# Patient Record
Sex: Male | Born: 1960 | Hispanic: No | Marital: Married | Smoking: Former smoker
Health system: Southern US, Community
[De-identification: ages and names within clinical notes are randomized; demographics above are authoritative.]

## PROBLEM LIST (undated history)

## (undated) DIAGNOSIS — I10 Essential (primary) hypertension: Secondary | ICD-10-CM

## (undated) DIAGNOSIS — I639 Cerebral infarction, unspecified: Secondary | ICD-10-CM

## (undated) DIAGNOSIS — C73 Malignant neoplasm of thyroid gland: Secondary | ICD-10-CM

## (undated) HISTORY — PX: THYROIDECTOMY: SHX17

---

## 2008-05-28 ENCOUNTER — Encounter: Admission: RE | Admit: 2008-05-28 | Discharge: 2008-05-28 | Payer: Self-pay | Admitting: Specialist

## 2016-12-06 ENCOUNTER — Emergency Department (HOSPITAL_BASED_OUTPATIENT_CLINIC_OR_DEPARTMENT_OTHER)
Admission: EM | Admit: 2016-12-06 | Discharge: 2016-12-07 | Disposition: A | Discharge status: Home / Self Care | Payer: Self-pay | Attending: Emergency Medicine | Admitting: Emergency Medicine

## 2016-12-06 ENCOUNTER — Encounter (HOSPITAL_BASED_OUTPATIENT_CLINIC_OR_DEPARTMENT_OTHER): Payer: Self-pay

## 2016-12-06 ENCOUNTER — Emergency Department (HOSPITAL_BASED_OUTPATIENT_CLINIC_OR_DEPARTMENT_OTHER): Payer: Self-pay

## 2016-12-06 DIAGNOSIS — R05 Cough: Secondary | ICD-10-CM | POA: Insufficient documentation

## 2016-12-06 DIAGNOSIS — R55 Syncope and collapse: Secondary | ICD-10-CM | POA: Insufficient documentation

## 2016-12-06 DIAGNOSIS — R054 Cough syncope: Secondary | ICD-10-CM

## 2016-12-06 DIAGNOSIS — Z87891 Personal history of nicotine dependence: Secondary | ICD-10-CM | POA: Insufficient documentation

## 2016-12-06 DIAGNOSIS — R200 Anesthesia of skin: Secondary | ICD-10-CM | POA: Insufficient documentation

## 2016-12-06 DIAGNOSIS — Z85850 Personal history of malignant neoplasm of thyroid: Secondary | ICD-10-CM | POA: Insufficient documentation

## 2016-12-06 DIAGNOSIS — Z8673 Personal history of transient ischemic attack (TIA), and cerebral infarction without residual deficits: Secondary | ICD-10-CM | POA: Insufficient documentation

## 2016-12-06 DIAGNOSIS — I10 Essential (primary) hypertension: Secondary | ICD-10-CM | POA: Insufficient documentation

## 2016-12-06 DIAGNOSIS — R202 Paresthesia of skin: Secondary | ICD-10-CM | POA: Insufficient documentation

## 2016-12-06 HISTORY — DX: Essential (primary) hypertension: I10

## 2016-12-06 HISTORY — DX: Malignant neoplasm of thyroid gland: C73

## 2016-12-06 HISTORY — DX: Cerebral infarction, unspecified: I63.9

## 2016-12-06 LAB — CBC WITH DIFFERENTIAL/PLATELET
BASOS PCT: 1 %
Basophils Absolute: 0.1 10*3/uL (ref 0.0–0.1)
EOS ABS: 0.4 10*3/uL (ref 0.0–0.7)
EOS PCT: 5 %
HEMATOCRIT: 40.8 % (ref 39.0–52.0)
HEMOGLOBIN: 14 g/dL (ref 13.0–17.0)
LYMPHS PCT: 23 %
Lymphs Abs: 1.8 10*3/uL (ref 0.7–4.0)
MCH: 27.6 pg (ref 26.0–34.0)
MCHC: 34.3 g/dL (ref 30.0–36.0)
MCV: 80.5 fL (ref 78.0–100.0)
MONOS PCT: 8 %
Monocytes Absolute: 0.6 10*3/uL (ref 0.1–1.0)
NEUTROS PCT: 63 %
Neutro Abs: 4.9 10*3/uL (ref 1.7–7.7)
Platelets: 258 10*3/uL (ref 150–400)
RBC: 5.07 MIL/uL (ref 4.22–5.81)
RDW: 14.8 % (ref 11.5–15.5)
WBC: 7.8 10*3/uL (ref 4.0–10.5)

## 2016-12-06 LAB — URINALYSIS, ROUTINE W REFLEX MICROSCOPIC
BILIRUBIN URINE: NEGATIVE
Glucose, UA: NEGATIVE mg/dL
HGB URINE DIPSTICK: NEGATIVE
KETONES UR: NEGATIVE mg/dL
Leukocytes, UA: NEGATIVE
NITRITE: NEGATIVE
PH: 6 (ref 5.0–8.0)
Protein, ur: 100 mg/dL — AB
SPECIFIC GRAVITY, URINE: 1.025 (ref 1.005–1.030)

## 2016-12-06 LAB — COMPREHENSIVE METABOLIC PANEL
ALK PHOS: 84 U/L (ref 38–126)
ALT: 25 U/L (ref 17–63)
AST: 26 U/L (ref 15–41)
Albumin: 4.3 g/dL (ref 3.5–5.0)
Anion gap: 8 (ref 5–15)
BUN: 17 mg/dL (ref 6–20)
CALCIUM: 9.2 mg/dL (ref 8.9–10.3)
CO2: 28 mmol/L (ref 22–32)
CREATININE: 1.1 mg/dL (ref 0.61–1.24)
Chloride: 103 mmol/L (ref 101–111)
GFR calc non Af Amer: >60 mL/min (ref >60)
Glucose, Bld: 104 mg/dL — ABNORMAL HIGH (ref 65–99)
Potassium: 3.6 mmol/L (ref 3.5–5.1)
SODIUM: 139 mmol/L (ref 135–145)
Total Bilirubin: 0.1 mg/dL — ABNORMAL LOW (ref 0.3–1.2)
Total Protein: 7.5 g/dL (ref 6.5–8.1)

## 2016-12-06 LAB — URINALYSIS, MICROSCOPIC (REFLEX): RBC / HPF: NONE SEEN RBC/hpf (ref 0–5)

## 2016-12-06 LAB — TROPONIN I: Troponin I: <0.03 ng/mL (ref <0.03)

## 2016-12-06 LAB — D-DIMER, QUANTITATIVE (NOT AT ARMC)

## 2016-12-06 NOTE — ED Notes (Signed)
Patient transported to X-ray 

## 2016-12-06 NOTE — ED Notes (Signed)
[facility]. Alert, NAD, calm, interactive, resps e/u, speaking in clear complete sentences, no dyspnea noted, skin W&D, VSS, BP elevated. Denies questions or needs. Mentions L head sore and L sided altered sensation remains,(denies: pain, sob, nausea, dizziness or visual changes).

## 2016-12-06 NOTE — ED Provider Notes (Signed)
[place] DEPT [facility] Provider Note   CSN: [id number] Arrival date & time: 12/06/16  1700     History   Chief Complaint Chief Complaint  Patient presents with  . Loss of Consciousness    HPI Tommy Gay is a 56 y.o. male.  HPI   Tommy Gay was drinking honey tea, then started coughing, choking on the tea, tried to put cup down.  Woke up on the carpet, not sure how long he was out, when he woke up was wondering where he was, how he got there. Stood up and noticed that left side of face was sore and left ear felt clogged up.  Was sitting for 8 minutes, wondering "who am I?, where am I? How did I get here?" then remembered what happened.  Not sure how long was out.  After that felt like left leg and left hand felt more weak.  Felt left chest "vein" was sore, sharp pain, only sore with palpation. Blood pressure was 161/109, pulse 79.  Talked to brother who is Psychologist, sport and exercise in [place] and he recommended going to ED.  In Dec 2016 in [place], had ?TIA because blood pressure was high, Got 3 kinds of medicine, then dropped and passed out, --nce that incident had weakness in left knee, walks abnormally due to weakness in elft leg but was told it was mini stroke.  Since yesterday this has gotten worse. Feels numbness and weakness of left side.  Woke up on right side.  Feels just one area on left chest is sore, sharp, there with palpation.  Now has some weakness and numbness in left arm and  Leg. Numbness in left side of face.   Bottom of the foot and 3 toes numb on left, for about 1 week.  Has not had that before.   Used to have some numbness in fingers but this in toes is new.  August 31st flew back from [place], took amoxicillin for cough, feels like it is improving. Initially had brown sputum.  feels like cough is brought on by cold Since syncope did cough up blood. No shortness of breath.  Past Medical History:  Diagnosis Date  . Hypertension   . Stroke ([place])   . Thyroid cancer ([place])      There are no active problems to display for this patient.   Past Surgical History:  Procedure Laterality Date  . THYROIDECTOMY         Home Medications    Prior to Admission medications   Not on File    Family History No family history on file.  Social History Social History  Substance Use Topics  . Smoking status: Former Research scientist (life sciences)  . Smokeless tobacco: Never Used  . Alcohol use No     Allergies   Patient has no known allergies.   Review of Systems Review of Systems  Constitutional: Negative for fever.  HENT: Negative for congestion and sore throat.   Eyes: Negative for visual disturbance.  Respiratory: Positive for cough (productive of mucus, light brown). Negative for shortness of breath.   Cardiovascular: Positive for chest pain (reports "pain in vein in front of chest when I touch the area").  Gastrointestinal: Positive for nausea. Negative for abdominal pain, blood in stool, diarrhea and vomiting.  Genitourinary: Negative for difficulty urinating and dysuria.  Musculoskeletal: Negative for back pain and neck stiffness.  Skin: Negative for rash.  Neurological: Positive for syncope, weakness, light-headedness and numbness. Negative for facial asymmetry and headaches.  Physical Exam Updated Vital Signs BP (!) 176/104   Pulse 71   Temp 99.5 F (37.5 C) (Oral)   Resp 20   Ht 5\' 8"  (1.727 m)   Wt 81.6 kg (180 lb)   SpO2 99%   BMI 27.37 kg/m   Physical Exam  Constitutional: He is oriented to person, place, and time. He appears well-developed and well-nourished. No distress.  HENT:  Head: Normocephalic and atraumatic.  Eyes: Conjunctivae and EOM are normal.  Neck: Normal range of motion.  Cardiovascular: Normal rate, regular rhythm, normal heart sounds and intact distal pulses.  Exam reveals no gallop and no friction rub.   No murmur heard. Pulmonary/Chest: Effort normal and breath sounds normal. No respiratory distress. He has no wheezes. He  has no rales.  Abdominal: Soft. He exhibits no distension. There is no tenderness. There is no guarding.  Musculoskeletal: He exhibits no edema.  Neurological: He is alert and oriented to person, place, and time. He has normal strength. A sensory deficit (altered sensation left face, left arm, left leg) is present. No cranial nerve deficit (numbness left side of face). Coordination normal. GCS eye subscore is 4. GCS verbal subscore is 5. GCS motor subscore is 6.  Skin: Skin is warm and dry. He is not diaphoretic.  Nursing note and vitals reviewed.    ED Treatments / Results  Labs (all labs ordered are listed, but only abnormal results are displayed) Labs Reviewed  COMPREHENSIVE METABOLIC PANEL - Abnormal; Notable for the following:       Result Value   Glucose, Bld 104 (*)    Total Bilirubin 0.1 (*)    All other components within normal limits  URINALYSIS, ROUTINE W REFLEX MICROSCOPIC - Abnormal; Notable for the following:    Protein, ur 100 (*)    All other components within normal limits  URINALYSIS, MICROSCOPIC (REFLEX) - Abnormal; Notable for the following:    Bacteria, UA RARE (*)    Squamous Epithelial / LPF 0-5 (*)    All other components within normal limits  CBC WITH DIFFERENTIAL/PLATELET  TROPONIN I  D-DIMER, QUANTITATIVE (NOT AT [facility])    EKG  EKG Interpretation  Date/Time:  Thursday December 06 2016 17:17:18 EDT Ventricular Rate:  85 PR Interval:  140 QRS Duration: 102 QT Interval:  368 QTC Calculation: 437 R Axis:   36 Text Interpretation:  Normal sinus rhythm Normal ECG No previous ECGs available Confirmed by Gareth Morgan [phone]) on 12/06/2016 5:26:13 PM       Radiology Dg Chest 2 View  Result Date: 12/06/2016 CLINICAL DATA:  Cough EXAM: CHEST  2 VIEW COMPARISON:  05/28/2008 FINDINGS: Normal heart size and mediastinal contours. No acute infiltrate or edema. No effusion or pneumothorax. No acute osseous findings. IMPRESSION: Negative chest.  Electronically Signed   By: Monte Fantasia M.D.   On: 12/06/2016 18:23   Ct Head Wo Contrast  Result Date: 12/06/2016 CLINICAL DATA:  Loss of consciousness 5 days ago with transient amnesia. LEFT skullbase feels hot. History of mini stroke 2016, hypertension and thyroid cancer. EXAM: CT HEAD WITHOUT CONTRAST TECHNIQUE: Contiguous axial images were obtained from the base of the skull through the vertex without intravenous contrast. COMPARISON:  None. FINDINGS: BRAIN: No intraparenchymal hemorrhage, mass effect nor midline shift. The ventricles and sulci are normal. No acute large vascular territory infarcts. No abnormal extra-axial fluid collections. Basal cisterns are patent. VASCULAR: Unremarkable. SKULL/SOFT TISSUES: No skull fracture. No significant soft tissue swelling. ORBITS/SINUSES: The included ocular globes and  orbital contents are normal.Soft tissue opacifies the LEFT frontal sinus with LEFT anterior ethmoid mucosal thickening. OTHER: None. IMPRESSION: 1. Normal noncontrast CT HEAD. 2. Severe LEFT frontal sinusitis. Electronically Signed   By: Elon Alas M.D.   On: 12/06/2016 18:23    Procedures Procedures (including critical care time)  Medications Ordered in ED Medications - No data to display   Initial Impression / Assessment and Plan / ED Course  I have reviewed the triage vital signs and the nursing notes.  Pertinent labs & imaging results that were available during my care of the patient were reviewed by me and considered in my medical decision making (see chart for details).     56 year old male with a history of CVA, hypertension, prior thyroid cancer presents with concern for cough, syncope followed by increased left-sided weakness from baseline per patient and left sided numbness.  EKG shows no acute abnormalities. No significant abnormalities and hemoglobin or electrolytes. Reports a sharp pain that he felt was there just with palpation earlier in the chest, troponin  negative, doubt ACS.   Given recent travel, syncope, hemoptysis, ddimer was ordered which was negative, and patient is low risk wells and doubt PE. CXR without abnormalities, doubt pneumonia, doubt TB. Pt afebrile, doubt MERS.   Regarding left sided numbness, consider new stroke or stroke reactivation.  He does not have weakness on exam, but subjectively feels this, and does report numbness of face, arm and leg on exam.  Discussed with Neurologist on call. Will assess with MRI brain and MRA head and neck, transfer to [facility] ED.    If MRI and MRA head and neck without significant findings, feel patient may be discharged and doubt TIA given duration of symptoms and presence of symptoms at this time.  Syncope most likely cough syncope by clinical history if no other significant findings on MR/MRA.  Patient transferred to [facility] ED for further care.   Final Clinical Impressions(s) / ED Diagnoses   Final diagnoses:  Syncope, unspecified syncope type  Numbness and tingling of left arm and leg  Left facial numbness  Cough syncope    New Prescriptions New Prescriptions   No medications on file     Gareth Morgan, MD 12/06/16 2115

## 2016-12-06 NOTE — ED Notes (Signed)
ED Provider at bedside. 

## 2016-12-06 NOTE — ED Triage Notes (Signed)
Pt states he has syncopal episode wed am-since then weakness to left UE and left LE-pain to left side of head-hx of stoke with left side weakness-however weakness increase since syncope-NAD-steady gait

## 2016-12-07 ENCOUNTER — Emergency Department (HOSPITAL_COMMUNITY): Payer: Self-pay

## 2016-12-07 MED ORDER — GADOBENATE DIMEGLUMINE 529 MG/ML IV SOLN
20.0000 mL | Freq: Once | INTRAVENOUS | Status: AC
  Administered 2016-12-07: 18 mL via INTRAVENOUS

## 2016-12-07 NOTE — ED Provider Notes (Signed)
12:15 AM Patient with questionable previous stroke in [place] 2016 sent from [facility] for MRI of brain and MRA of head and neck.   Pt seen by myself on arrival. He is stable. Appears well. No gross neuro deficits on exam.  Reports 20% sensation deficit in left upper extremity, stable. He is aware we are holding for MRI. Plan is based upon MRI findings. If negative, will be discharging to home.  BP (!) 175/114 (BP Location: Left Arm)   Pulse 75   Temp 98.1 F (36.7 C)   Resp 17   Ht 5\' 8"  (1.727 m)   Wt 81.6 kg (180 lb)   SpO2 98%   BMI 27.37 kg/m   Handoff to [facility] at shift change pending completion of imaging.    Carlisle Cater, PA-C 12/07/16 0017    Daleen Bo, MD 12/07/16 [phone]

## 2016-12-07 NOTE — ED Provider Notes (Signed)
Patient care assumed from [facility], PA-C at shift change. Please see his note for further. Briefly, patient has history of a question of stroke in the past. He is transferred from [facility] for an MRI of his brain MRI of his head and his neck. Plan it should changes for MRI and MRA. If imaging is unremarkable plan is for discharge.  4:00 AM MRI and MRA has been completed and showed no acute findings. At my evaluation the patient is resting comfortably in bed. He denies any current complaints. I discussed test result findings. I encouraged follow-up with primary care and provided him with follow-up with neurology to use as needed. I discussed return precautions. I advised the patient to follow-up with their primary care provider this week. I advised the patient to return to the emergency department with new or worsening symptoms or new concerns. The patient verbalized understanding and agreement with plan.      [name] Contrast  Result Date: 12/07/2016 CLINICAL DATA:  56 y/o M; syncopal episode with increased left-sided weakness. Prior episode of stroke. EXAM: MRI HEAD WITHOUT CONTRAST MRA HEAD WITHOUT CONTRAST MRA OF THE NECK WITHOUT AND WITH CONTRAST TECHNIQUE: Multiplanar, multiecho pulse sequences of the brain, circle of willis and surrounding structures were obtained without intravenous contrast. Angiographic images of the neck were obtained using MRA technique without and with intravenous contrast. CONTRAST:  87mL MULTIHANCE GADOBENATE DIMEGLUMINE 529 MG/ML IV SOLN COMPARISON:  12/06/2016 CT head. FINDINGS: MR HEAD FINDINGS Brain: No acute infarction, hemorrhage, hydrocephalus, extra-axial collection or mass lesion. Mild parenchymal volume loss of the brain. Vascular: As below. Skull and upper cervical spine: Normal marrow signal. Sinuses/Orbits: Opacification of left frontal sinus and left anterior ethmoid air cells. Orbits are unremarkable. Trace mastoid effusions. Other: None.  MRA HEAD FINDINGS Anterior circulation: No large vessel occlusion, aneurysm, or significant stenosis is identified. Posterior circulation: No large vessel occlusion, aneurysm, or significant stenosis is identified. Anatomic variation: Small anterior communicating artery and possible left posterior communicating artery. No right posterior communicating artery identified, likely hypoplastic or absent. MRA NECK FINDINGS Aortic arch: Patent.  Bovine variant anatomy. Right common carotid artery: Patent. Right internal carotid artery: Patent. Right vertebral artery: Patent. Left common carotid artery: Patent. Left Internal carotid artery: Patent. Left Vertebral artery: Patent. There is no evidence of hemodynamically significant stenosis by NASCET criteria, dissection, or aneurysm. IMPRESSION: MRI head: 1. No acute intracranial abnormality identified. 2. Mild brain parenchymal volume loss. 3. 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 Trace mastoid effusions. MRA head: Patent circle of Willis. No large vessel occlusion, aneurysm, or significant stenosis is identified. MRA neck: Patent carotid and vertebral arteries. No hemodynamically significant stenosis by NASCET criteria, dissection, or aneurysm. Electronically Signed   By: Kristine Garbe M.D.   On: 12/07/2016 03:12   Mr Angiogram Neck W Or Wo Contrast  Result Date: 12/07/2016 CLINICAL DATA:  56 y/o M; syncopal episode with increased left-sided weakness. Prior episode of stroke. EXAM: MRI HEAD WITHOUT CONTRAST MRA HEAD WITHOUT CONTRAST MRA OF THE NECK WITHOUT AND WITH CONTRAST TECHNIQUE: Multiplanar, multiecho pulse sequences of the brain, circle of willis and surrounding structures were obtained without intravenous contrast. Angiographic images of the neck were obtained using MRA technique without and with intravenous contrast. CONTRAST:  28mL MULTIHANCE GADOBENATE DIMEGLUMINE 529 MG/ML IV SOLN COMPARISON:  12/06/2016 CT head.  FINDINGS: MR HEAD FINDINGS Brain: No acute infarction, hemorrhage, hydrocephalus, extra-axial collection or mass lesion. Mild parenchymal  volume loss of the brain. Vascular: As below. Skull and upper cervical spine: Normal marrow signal. Sinuses/Orbits: Opacification of left frontal sinus and left anterior ethmoid air cells. Orbits are unremarkable. Trace mastoid effusions. Other: None. MRA HEAD FINDINGS Anterior circulation: No large vessel occlusion, aneurysm, or significant stenosis is identified. Posterior circulation: No large vessel occlusion, aneurysm, or significant stenosis is identified. Anatomic variation: Small anterior communicating artery and possible left posterior communicating artery. No right posterior communicating artery identified, likely hypoplastic or absent. MRA NECK FINDINGS Aortic arch: Patent.  Bovine variant anatomy. Right common carotid artery: Patent. Right internal carotid artery: Patent. Right vertebral artery: Patent. Left common carotid artery: Patent. Left Internal carotid artery: Patent. Left Vertebral artery: Patent. There is no evidence of hemodynamically significant stenosis by NASCET criteria, dissection, or aneurysm. IMPRESSION: MRI head: 1. No acute intracranial abnormality identified. 2. Mild brain parenchymal volume loss. 3. 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 Trace mastoid effusions. MRA head: Patent circle of Willis. No large vessel occlusion, aneurysm, or significant stenosis is identified. MRA neck: Patent carotid and vertebral arteries. No hemodynamically significant stenosis by NASCET criteria, dissection, or aneurysm. Electronically Signed   By: Kristine Garbe M.D.   On: 12/07/2016 03:12   Mr Brain Wo Contrast  Result Date: 12/07/2016 CLINICAL DATA:  56 y/o M; syncopal episode with increased left-sided weakness. Prior episode of stroke. EXAM: MRI HEAD WITHOUT CONTRAST MRA HEAD WITHOUT CONTRAST MRA OF THE NECK WITHOUT  AND WITH CONTRAST TECHNIQUE: Multiplanar, multiecho pulse sequences of the brain, circle of willis and surrounding structures were obtained without intravenous contrast. Angiographic images of the neck were obtained using MRA technique without and with intravenous contrast. CONTRAST:  41mL MULTIHANCE GADOBENATE DIMEGLUMINE 529 MG/ML IV SOLN COMPARISON:  12/06/2016 CT head. FINDINGS: MR HEAD FINDINGS Brain: No acute infarction, hemorrhage, hydrocephalus, extra-axial collection or mass lesion. Mild parenchymal volume loss of the brain. Vascular: As below. Skull and upper cervical spine: Normal marrow signal. Sinuses/Orbits: Opacification of left frontal sinus and left anterior ethmoid air cells. Orbits are unremarkable. Trace mastoid effusions. Other: None. MRA HEAD FINDINGS Anterior circulation: No large vessel occlusion, aneurysm, or significant stenosis is identified. Posterior circulation: No large vessel occlusion, aneurysm, or significant stenosis is identified. Anatomic variation: Small anterior communicating artery and possible left posterior communicating artery. No right posterior communicating artery identified, likely hypoplastic or absent. MRA NECK FINDINGS Aortic arch: Patent.  Bovine variant anatomy. Right common carotid artery: Patent. Right internal carotid artery: Patent. Right vertebral artery: Patent. Left common carotid artery: Patent. Left Internal carotid artery: Patent. Left Vertebral artery: Patent. There is no evidence of hemodynamically significant stenosis by NASCET criteria, dissection, or aneurysm. IMPRESSION: MRI head: 1. No acute intracranial abnormality identified. 2. Mild brain parenchymal volume loss. 3. 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 Trace mastoid effusions. MRA head: Patent circle of Willis. No large vessel occlusion, aneurysm, or significant stenosis is identified. MRA neck: Patent carotid and vertebral arteries. No hemodynamically significant  stenosis by NASCET criteria, dissection, or aneurysm. Electronically Signed   By: Kristine Garbe M.D.   On: 12/07/2016 03:12    Syncope, unspecified syncope type  Numbness and tingling of left arm and leg  Left facial numbness  Cough syncope       Waynetta Pean, PA-C 12/07/16 0409    Ripley Fraise, MD 12/07/16 [phone]

## 2016-12-07 NOTE — ED Notes (Signed)
Pt returned from MRI °

## 2016-12-07 NOTE — ED Notes (Signed)
Patient transported to MRI 

## 2018-08-16 IMAGING — CR DG CHEST 2V
2 series · 2 of 2 positions shown · non-contrast
Comparison: 05/28/2008

CLINICAL DATA: Cough

EXAM:
CHEST  2 VIEW

[w chest pa]
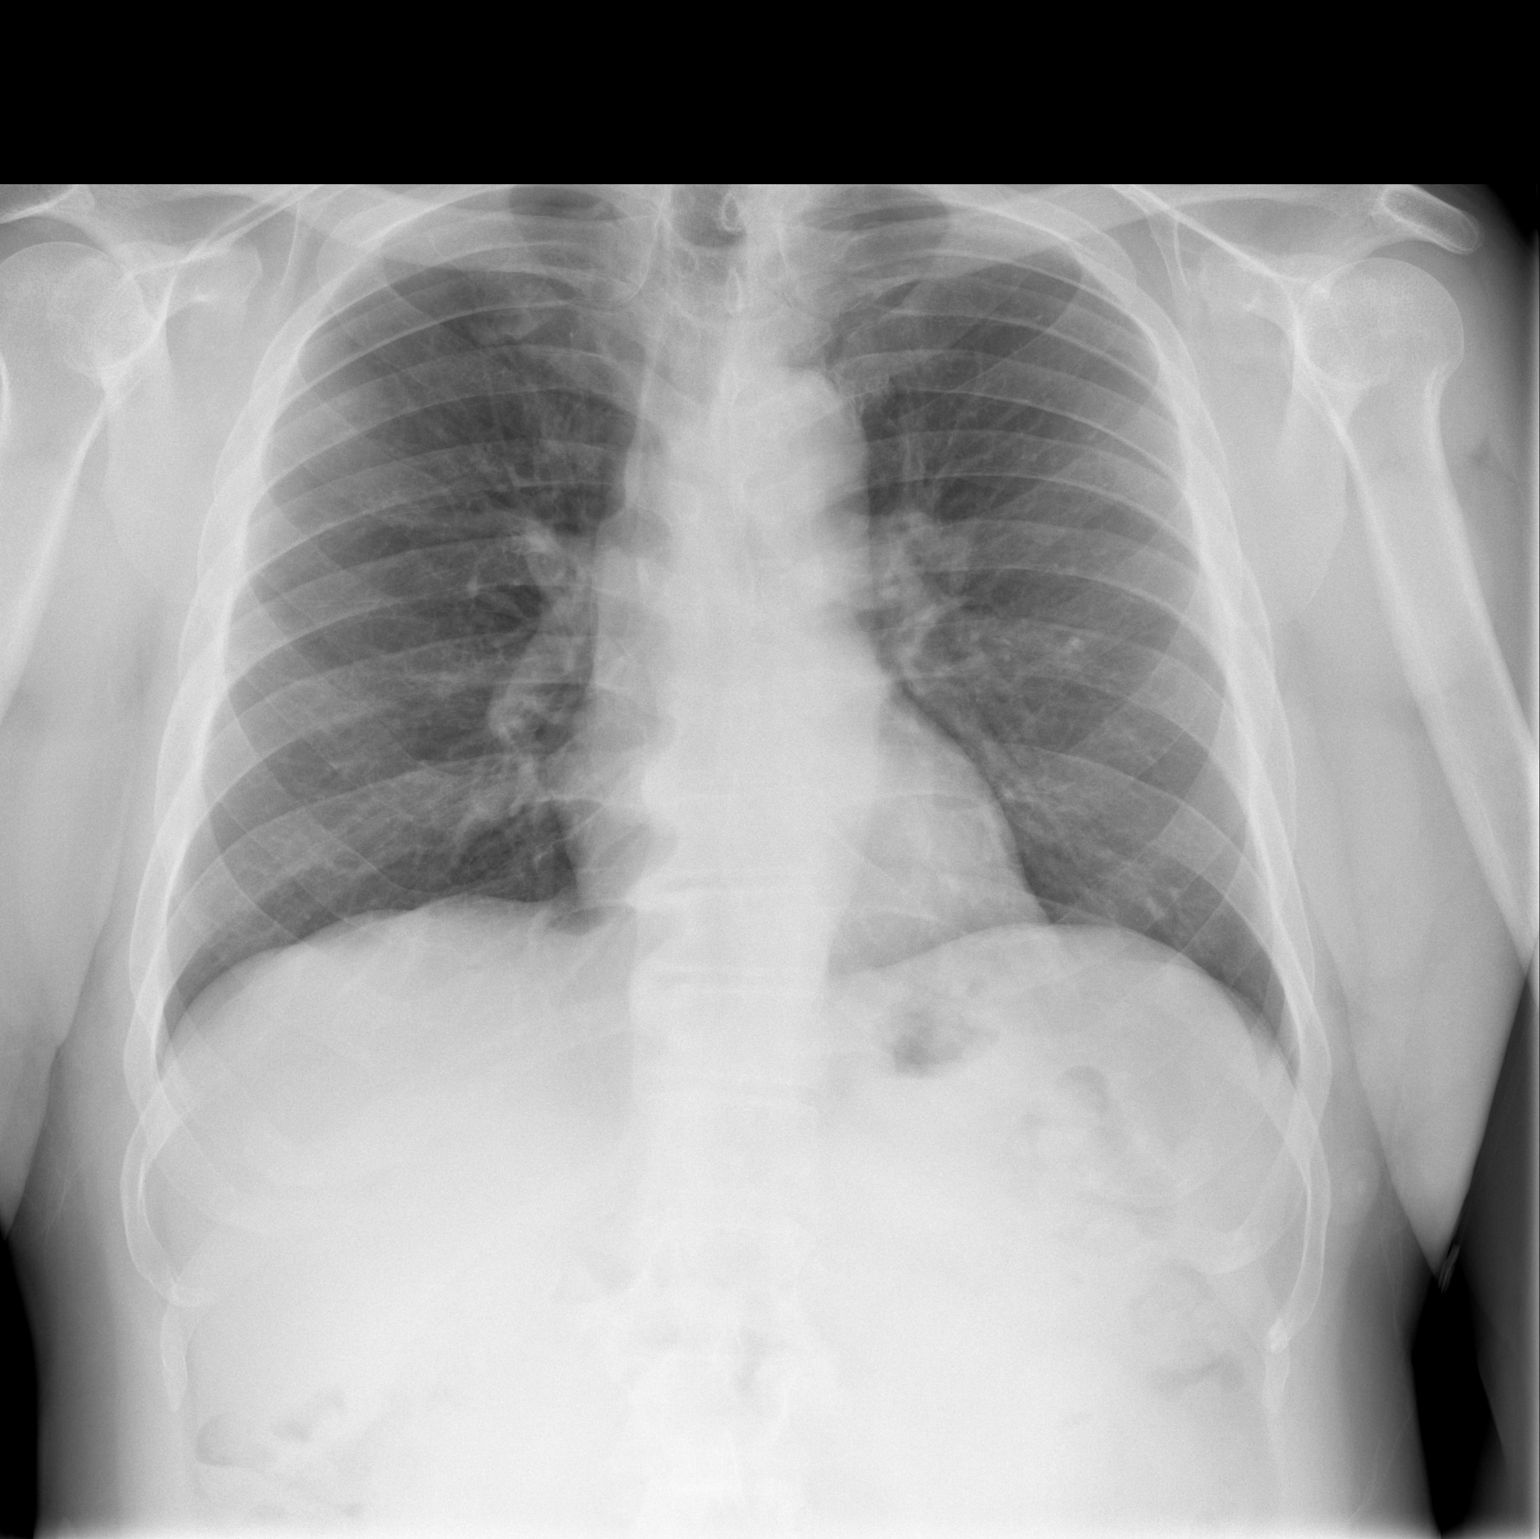

[w chest lat]
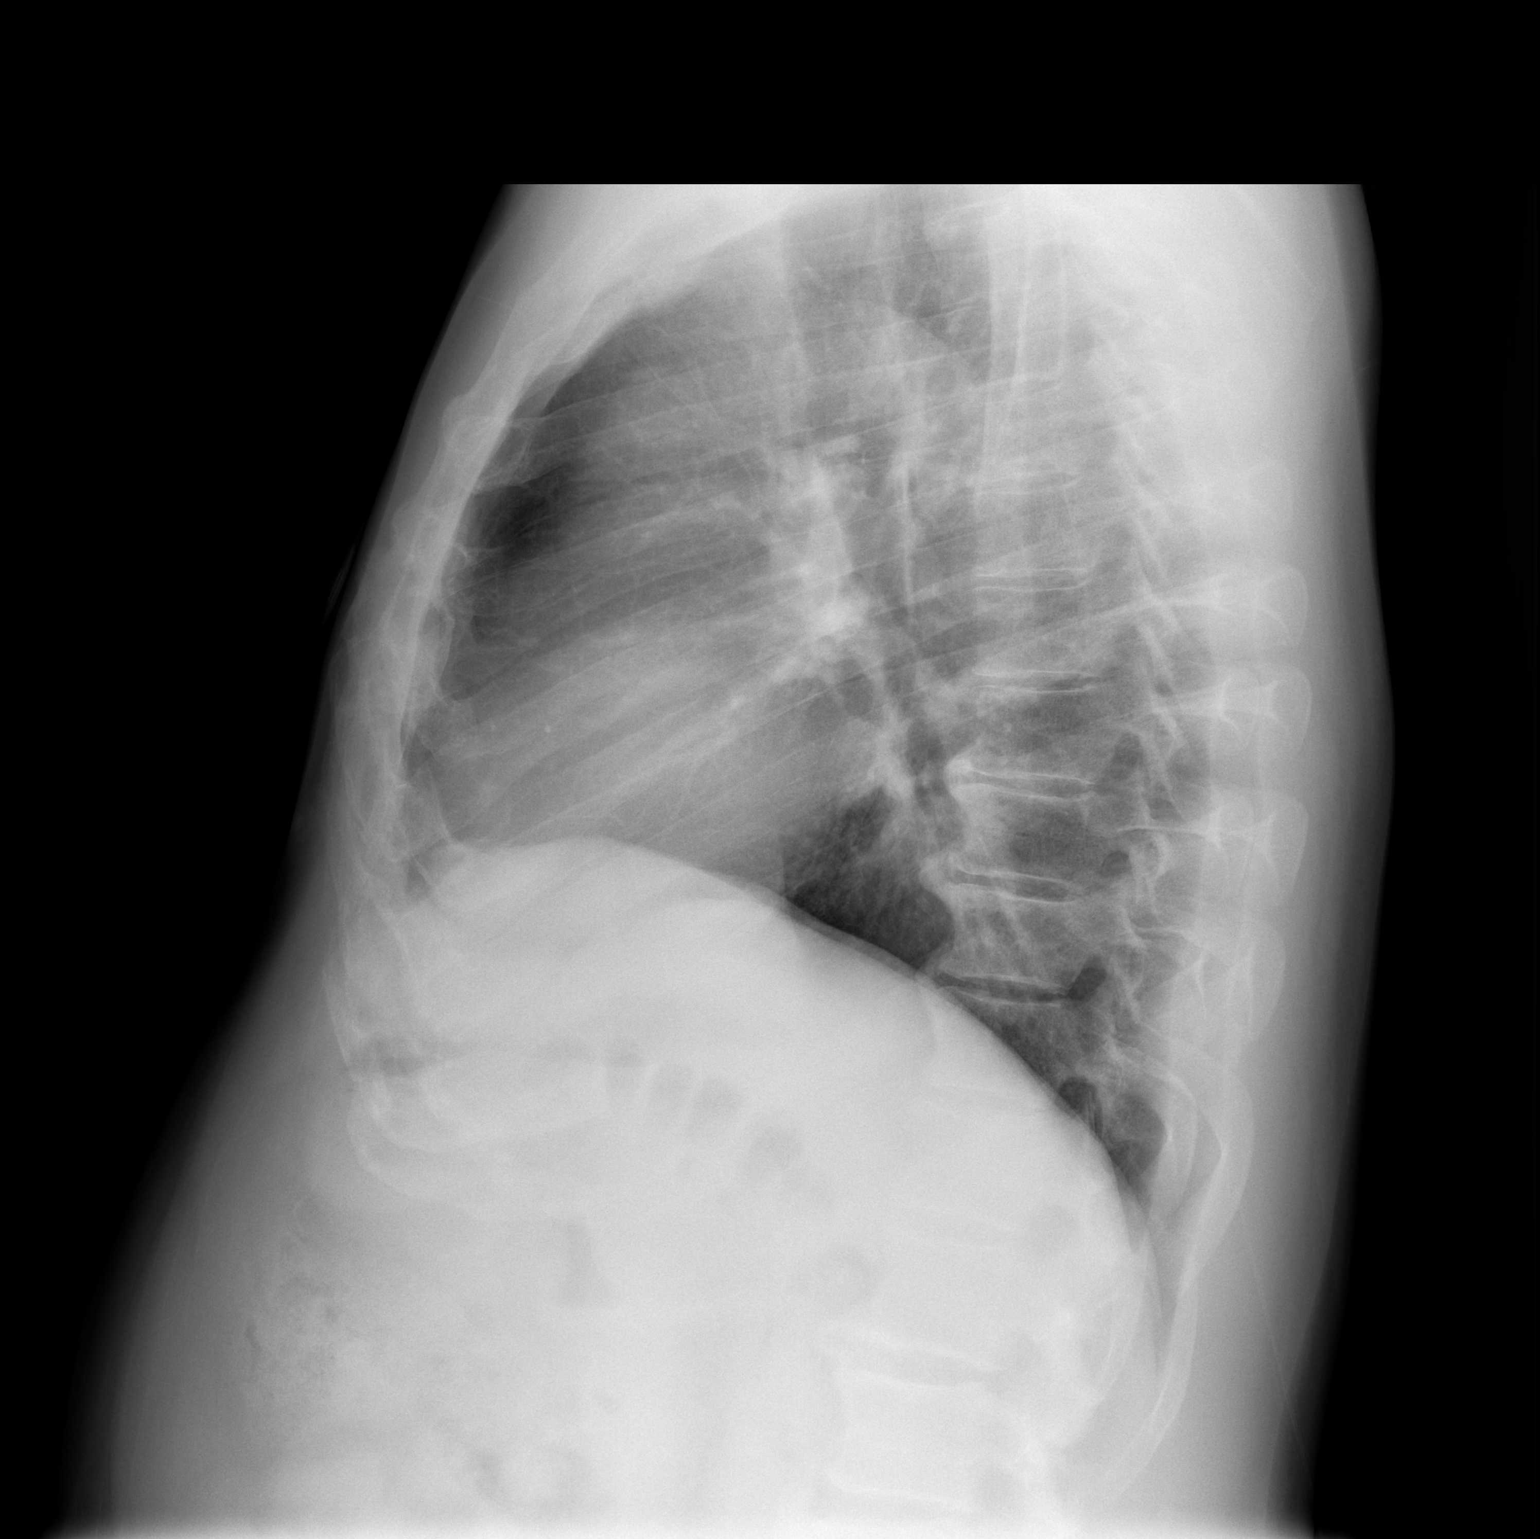

[2 of 2 positions shown; findings below may reference images not displayed]

FINDINGS: Normal heart size and mediastinal contours. No acute infiltrate or
edema. No effusion or pneumothorax. No acute osseous findings.
IMPRESSION: Negative chest.

## 2018-08-17 IMAGING — MR MR MRA HEAD W/O CM
21 series · 21 of 21 positions shown · IV contrast (Yes   MULTIHANCE)
Comparison: 12/06/2016 CT head.

CLINICAL DATA: 56 y/o M; syncopal episode with increased left-sided
weakness. Prior episode of stroke.

EXAM:
MRI HEAD WITHOUT CONTRAST
MRA HEAD WITHOUT CONTRAST
MRA OF THE NECK WITHOUT AND WITH CONTRAST
TECHNIQUE: Multiplanar, multiecho pulse sequences of the brain, circle of
willis and surrounding structures were obtained without intravenous
contrast. Angiographic images of the neck were obtained using MRA
technique without and with intravenous contrast.
CONTRAST:  18mL MULTIHANCE GADOBENATE DIMEGLUMINE 529 MG/ML IV SOLN

[Series 3: T1 · sagittal · 5.0mm · 0.47mm/px · 1 of 23 slices shown]
[im 1/23]
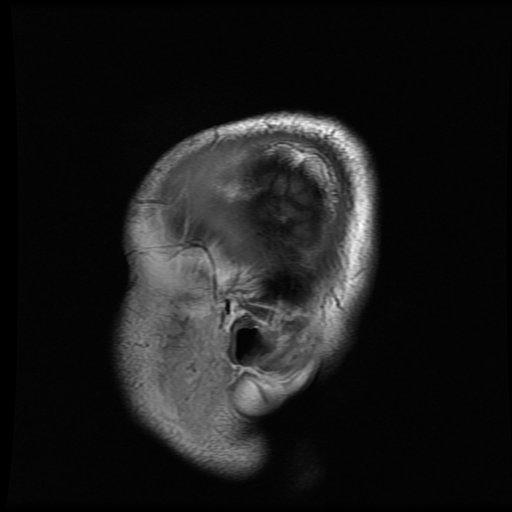

[Series 4: DWI · axial · 3.0mm · 1.09mm/px · 1 of 98 slices shown (1 of 4)]
[im 1/98]
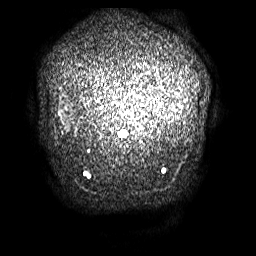

[Series 5: (id) mt fs · axial · 1.4mm · 0.43mm/px · 1 of 136 slices shown]
[im 1/136]
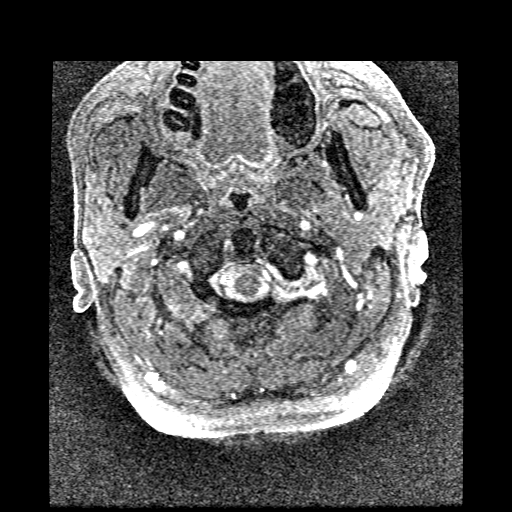

[Series 6: DWI · coronal · 5.0mm · 1.09mm/px · 1 of 60 slices shown (2 of 4)]
[im 1/60]
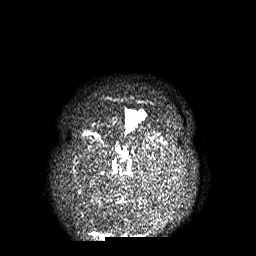

[Series 7: T2 · axial · 5.0mm · 0.47mm/px · 1 of 24 slices shown (1 of 2)]
[im 1/24]
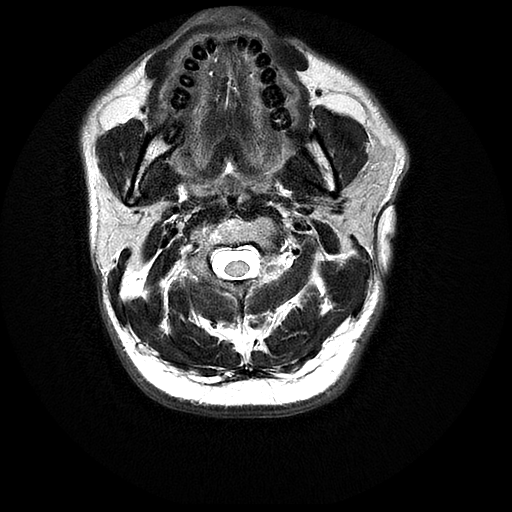

[Series 8: FLAIR · axial · 5.0mm · 0.47mm/px · 1 of 28 slices shown]
[im 1/28]
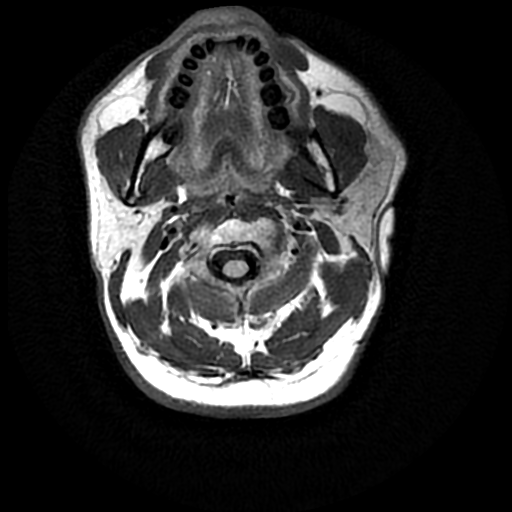

[Series 9: ax mpgr · axial · 5.0mm · 0.47mm/px · 1 of 24 slices shown]
[im 1/24]
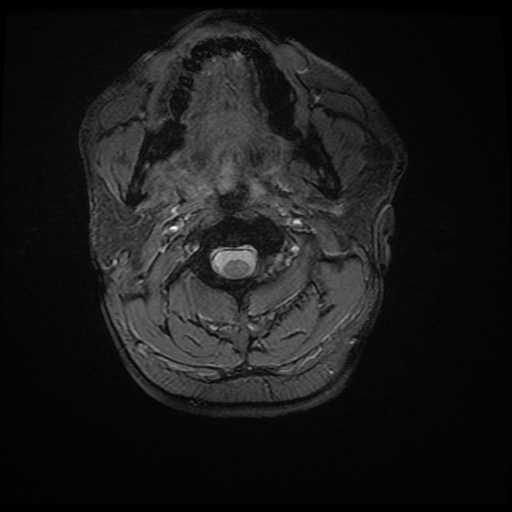

[Series 10: T2 · coronal · 5.0mm · 0.41mm/px · 1 of 24 slices shown (2 of 2)]
[im 1/24]
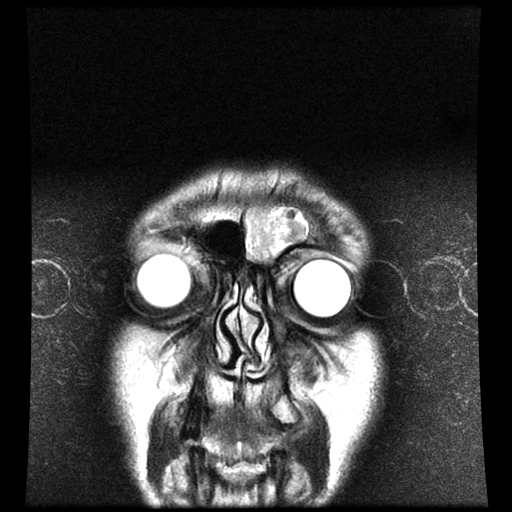

[Series 11: ax 3d spgr · axial · 3.0mm · 0.47mm/px · 1 of 104 slices shown]
[im 1/104]
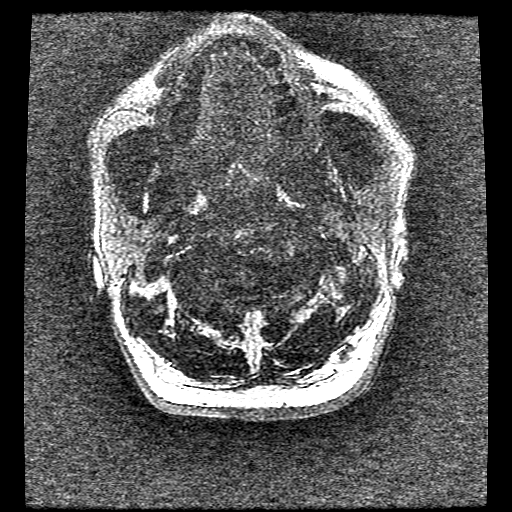

[Series 12: T2-star · axial · 5.0mm · 1.76mm/px · 1 of 41 slices shown]
[im 1/41]
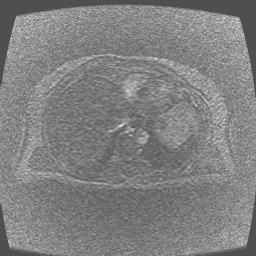

[Series 13: ax (id) · axial · 2.8mm · 0.47mm/px · 1 of 140 slices shown]
[im 1/140]
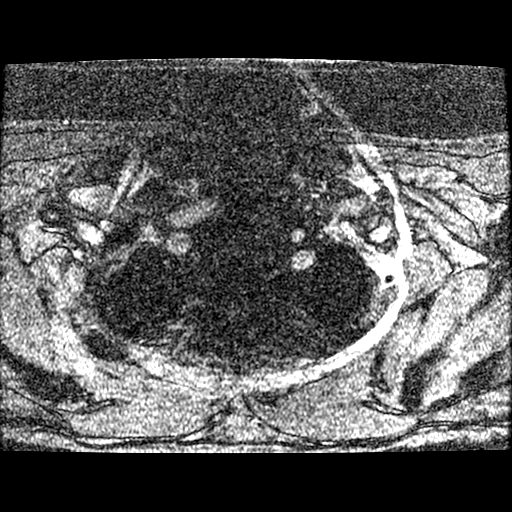

[Series 400: DWI · axial · 3.0mm · 1.09mm/px · 1 of 49 slices shown (3 of 4)]
[im 1/49]
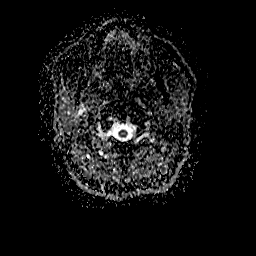

[Series 600: DWI · coronal · 5.0mm · 1.09mm/px · 1 of 30 slices shown (4 of 4)]
[im 1/30]
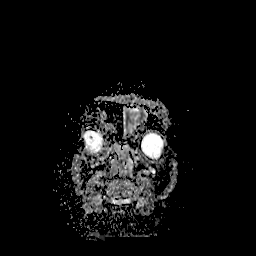

[Series 1300: col:ax (id) · axial · 2.8mm · 0.47mm/px · 1 of 1 slices shown]
[im 1/1]
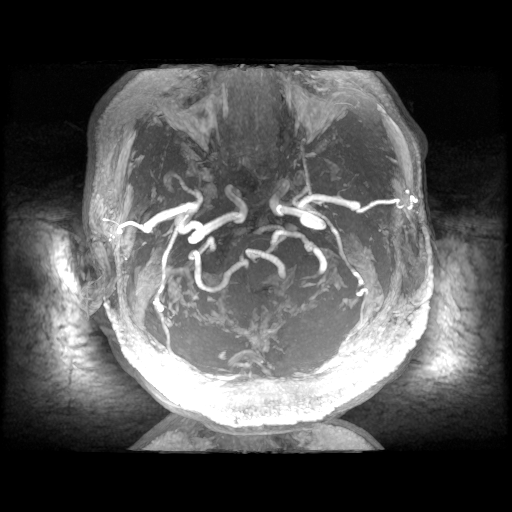

[Series 1301: pjn:ax (id) · sagittal · 2.8mm · 0.47mm/px · 1 of 19 slices shown]
[im 1/19]
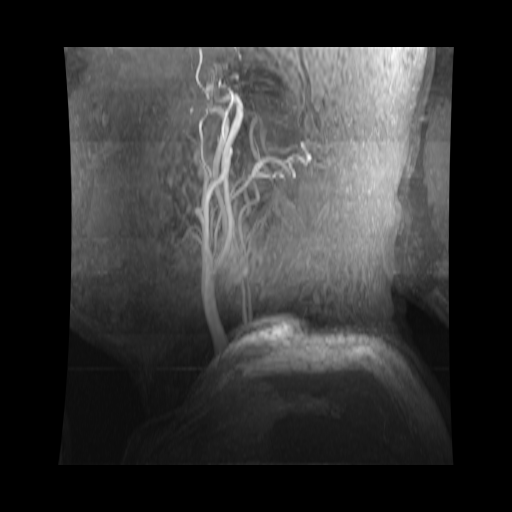

[Series 1400: cor cemra ft · coronal · 1.4mm · 0.59mm/px · 1 of 92 slices shown (1 of 2)]
[im 1/92]
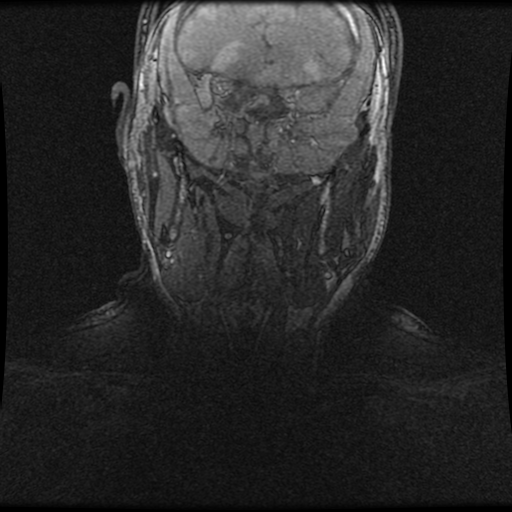

[Series 1500: cor cemra ft · coronal · 1.4mm · 0.59mm/px · 1 of 92 slices shown (2 of 2)]
[im 1/92]
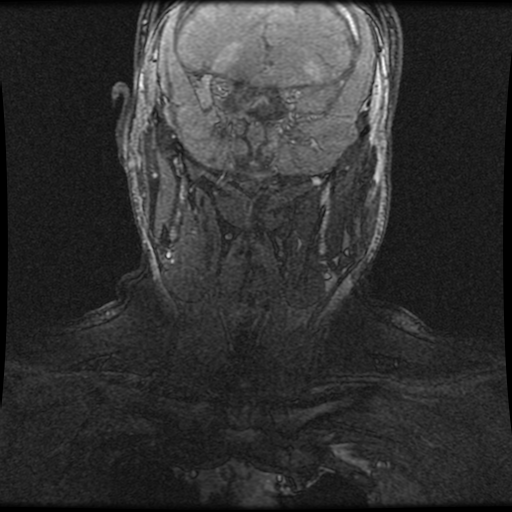

[Series 1501: ph1/cor cemra ft · coronal · 1.4mm · 0.59mm/px · 1 of 90 slices shown]
[im 1/90]
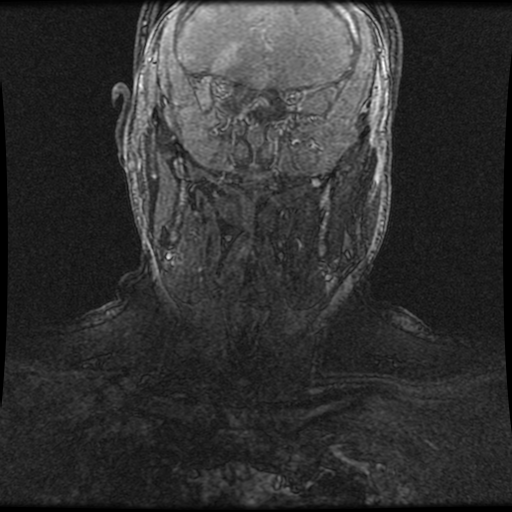

[Series 1502: ph2/cor cemra ft · coronal · 1.4mm · 0.59mm/px · 1 of 92 slices shown]
[im 1/92]
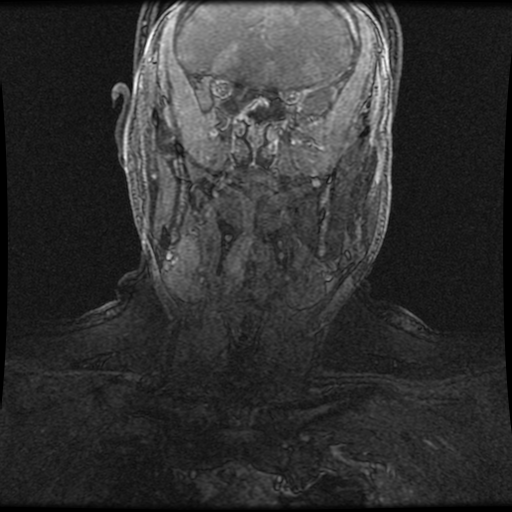

[((id)/(id)/1)-((id)/(id)/1) · coronal · 1.4mm · 0.59mm/px · 1 of 91 slices shown (1 of 2)]
[im 1/91]
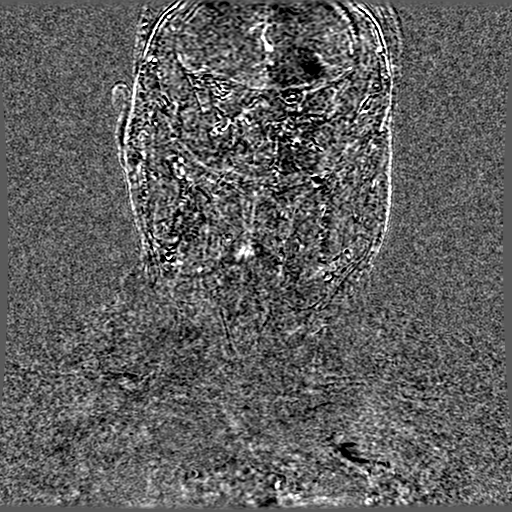

[((id)/(id)/1)-((id)/(id)/1) · coronal · 1.4mm · 0.59mm/px · 1 of 92 slices shown (2 of 2)]
[im 1/92]
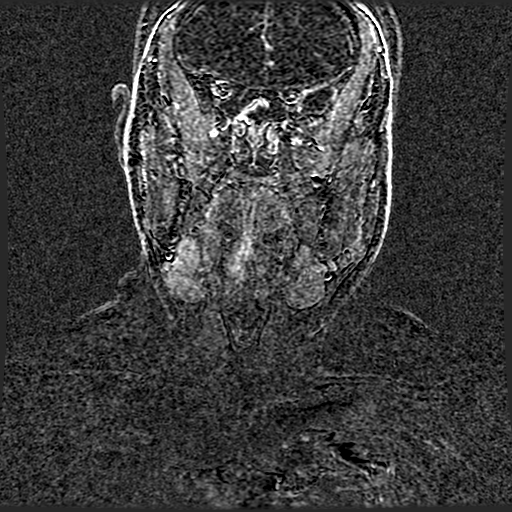

[21 of 21 positions shown; findings below may reference images not displayed]

FINDINGS: MR HEAD FINDINGS

Brain: No acute infarction, hemorrhage, hydrocephalus, extra-axial
collection or mass lesion. Mild parenchymal volume loss of the
brain.

Vascular: As below.

Skull and upper cervical spine: Normal marrow signal.

Sinuses/Orbits: Opacification of left frontal sinus and left
anterior ethmoid air cells. Orbits are unremarkable. Trace mastoid
effusions.

Other: None.

MRA HEAD FINDINGS

Anterior circulation: No large vessel occlusion, aneurysm, or
significant stenosis is identified.

Posterior circulation: No large vessel occlusion, aneurysm, or
significant stenosis is identified.

Anatomic variation: Small anterior communicating artery and possible
left posterior communicating artery. No right posterior
communicating artery identified, likely hypoplastic or absent.

MRA NECK FINDINGS

Aortic arch: Patent.  Bovine variant anatomy.

Right common carotid artery: Patent.

Right internal carotid artery: Patent.

Right vertebral artery: Patent.

Left common carotid artery: Patent.

Left Internal carotid artery: Patent.

Left Vertebral artery: Patent.

There is no evidence of hemodynamically significant stenosis by
NASCET criteria, dissection, or aneurysm.
IMPRESSION: MRI head:

1. No acute intracranial abnormality identified.
2. Mild brain parenchymal volume loss.
3. Occlusion of left frontal sinus and left anterior ethmoid air
cells. Trace mastoid effusions.
MRA head:

Patent circle of Willis. No large vessel occlusion, aneurysm, or
significant stenosis is identified.

MRA neck:

Patent carotid and vertebral arteries. No hemodynamically
significant stenosis by NASCET criteria, dissection, or aneurysm.

By: Rtoyota Joshjax M.D.
